# Patient Record
Sex: Male | Born: 1990 | Race: White | Hispanic: No | Marital: Single | Smoking: Never smoker
Health system: Southern US, Community
[De-identification: ages and names within clinical notes are randomized; demographics above are authoritative.]

---

## 2016-07-26 DIAGNOSIS — Z86018 Personal history of other benign neoplasm: Secondary | ICD-10-CM | POA: Diagnosis not present

## 2016-07-26 DIAGNOSIS — D2271 Melanocytic nevi of right lower limb, including hip: Secondary | ICD-10-CM | POA: Diagnosis not present

## 2016-07-26 DIAGNOSIS — D485 Neoplasm of uncertain behavior of skin: Secondary | ICD-10-CM | POA: Diagnosis not present

## 2016-07-26 DIAGNOSIS — D225 Melanocytic nevi of trunk: Secondary | ICD-10-CM | POA: Diagnosis not present

## 2016-07-26 DIAGNOSIS — D2262 Melanocytic nevi of left upper limb, including shoulder: Secondary | ICD-10-CM | POA: Diagnosis not present

## 2016-07-26 DIAGNOSIS — D224 Melanocytic nevi of scalp and neck: Secondary | ICD-10-CM | POA: Diagnosis not present

## 2017-01-04 DIAGNOSIS — J0100 Acute maxillary sinusitis, unspecified: Secondary | ICD-10-CM | POA: Diagnosis not present

## 2017-02-09 ENCOUNTER — Emergency Department (HOSPITAL_COMMUNITY)
Admission: EM | Admit: 2017-02-09 | Discharge: 2017-02-10 | Disposition: A | Discharge status: Home / Self Care | Payer: Federal, State, Local not specified - PPO | Attending: Emergency Medicine | Admitting: Emergency Medicine

## 2017-02-09 ENCOUNTER — Emergency Department (HOSPITAL_COMMUNITY): Payer: Federal, State, Local not specified - PPO

## 2017-02-09 ENCOUNTER — Encounter (HOSPITAL_COMMUNITY): Payer: Self-pay

## 2017-02-09 DIAGNOSIS — W320XXA Accidental handgun discharge, initial encounter: Secondary | ICD-10-CM | POA: Diagnosis not present

## 2017-02-09 DIAGNOSIS — Y939 Activity, unspecified: Secondary | ICD-10-CM | POA: Diagnosis not present

## 2017-02-09 DIAGNOSIS — S79922A Unspecified injury of left thigh, initial encounter: Secondary | ICD-10-CM | POA: Diagnosis not present

## 2017-02-09 DIAGNOSIS — Y999 Unspecified external cause status: Secondary | ICD-10-CM | POA: Insufficient documentation

## 2017-02-09 DIAGNOSIS — W3400XA Accidental discharge from unspecified firearms or gun, initial encounter: Secondary | ICD-10-CM | POA: Diagnosis not present

## 2017-02-09 DIAGNOSIS — Y929 Unspecified place or not applicable: Secondary | ICD-10-CM | POA: Insufficient documentation

## 2017-02-09 DIAGNOSIS — Z23 Encounter for immunization: Secondary | ICD-10-CM | POA: Insufficient documentation

## 2017-02-09 DIAGNOSIS — S85902A Unspecified injury of unspecified blood vessel at lower leg level, left leg, initial encounter: Secondary | ICD-10-CM | POA: Diagnosis not present

## 2017-02-09 DIAGNOSIS — T148XXA Other injury of unspecified body region, initial encounter: Secondary | ICD-10-CM | POA: Diagnosis not present

## 2017-02-09 DIAGNOSIS — S71132A Puncture wound without foreign body, left thigh, initial encounter: Secondary | ICD-10-CM

## 2017-02-09 DIAGNOSIS — S71102A Unspecified open wound, left thigh, initial encounter: Secondary | ICD-10-CM | POA: Diagnosis not present

## 2017-02-09 LAB — PREPARE FRESH FROZEN PLASMA
Unit division: 0
Unit division: 0

## 2017-02-09 LAB — I-STAT CHEM 8, ED
BUN: 15 mg/dL (ref 6–20)
CALCIUM ION: 1.1 mmol/L — AB (ref 1.15–1.40)
CHLORIDE: 104 mmol/L (ref 101–111)
Creatinine, Ser: 1 mg/dL (ref 0.61–1.24)
GLUCOSE: 96 mg/dL (ref 65–99)
HCT: 44 % (ref 39.0–52.0)
HEMOGLOBIN: 15 g/dL (ref 13.0–17.0)
POTASSIUM: 3.5 mmol/L (ref 3.5–5.1)
SODIUM: 139 mmol/L (ref 135–145)
TCO2: 23 mmol/L (ref 0–100)

## 2017-02-09 LAB — BPAM FFP
BLOOD PRODUCT EXPIRATION DATE: 201806302359
BLOOD PRODUCT EXPIRATION DATE: 201806302359
ISSUE DATE / TIME: 201806142223
ISSUE DATE / TIME: 201806142223
UNIT TYPE AND RH: 6200
Unit Type and Rh: 600

## 2017-02-09 LAB — I-STAT CG4 LACTIC ACID, ED: LACTIC ACID, VENOUS: 1.21 mmol/L (ref 0.5–1.9)

## 2017-02-09 MED ORDER — TETANUS-DIPHTH-ACELL PERTUSSIS 5-2.5-18.5 LF-MCG/0.5 IM SUSP
0.5000 mL | Freq: Once | INTRAMUSCULAR | Status: AC
  Administered 2017-02-09: 0.5 mL via INTRAMUSCULAR
  Filled 2017-02-09: qty 0.5

## 2017-02-09 MED ORDER — IOPAMIDOL (ISOVUE-370) INJECTION 76%
INTRAVENOUS | Status: AC
  Administered 2017-02-09: 100 mL
  Filled 2017-02-09: qty 100

## 2017-02-09 MED ORDER — HYDROCODONE-ACETAMINOPHEN 5-325 MG PO TABS
1.0000 | ORAL_TABLET | Freq: Once | ORAL | Status: AC
  Administered 2017-02-09: 1 via ORAL
  Filled 2017-02-09: qty 1

## 2017-02-09 MED ORDER — HYDROCODONE-ACETAMINOPHEN 5-325 MG PO TABS: 1.0000 | ORAL_TABLET | Freq: Four times a day (QID) | ORAL | 0 refills | Status: AC | PRN
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
## 2017-02-09 NOTE — Discharge Instructions (Addendum)
Wound will heal by secondary intention. Please return to ED for spreading redness, increasing pain, or pustular drainage from wound. Please followup in trauma clinic.

## 2017-02-09 NOTE — ED Notes (Signed)
Patient transported to CT 

## 2017-02-09 NOTE — ED Notes (Signed)
Pt stable, ambulatory, states understanding of discharge instructions 

## 2017-02-10 LAB — BPAM RBC
BLOOD PRODUCT EXPIRATION DATE: 201807022359
Blood Product Expiration Date: 201806212359
ISSUE DATE / TIME: 201806142222
ISSUE DATE / TIME: 201806142222
UNIT TYPE AND RH: 9500
Unit Type and Rh: 9500

## 2017-02-10 LAB — TYPE AND SCREEN
ABO/RH(D): O POS
Antibody Screen: NEGATIVE
UNIT DIVISION: 0
Unit division: 0

## 2017-02-10 LAB — ABO/RH: ABO/RH(D): O POS

## 2017-02-23 DIAGNOSIS — W3400XA Accidental discharge from unspecified firearms or gun, initial encounter: Secondary | ICD-10-CM | POA: Diagnosis not present

## 2017-02-23 DIAGNOSIS — S71109A Unspecified open wound, unspecified thigh, initial encounter: Secondary | ICD-10-CM | POA: Diagnosis not present

## 2017-07-26 DIAGNOSIS — Z86018 Personal history of other benign neoplasm: Secondary | ICD-10-CM | POA: Diagnosis not present

## 2017-07-26 DIAGNOSIS — D2272 Melanocytic nevi of left lower limb, including hip: Secondary | ICD-10-CM | POA: Diagnosis not present

## 2017-07-26 DIAGNOSIS — D485 Neoplasm of uncertain behavior of skin: Secondary | ICD-10-CM | POA: Diagnosis not present

## 2017-07-26 DIAGNOSIS — D224 Melanocytic nevi of scalp and neck: Secondary | ICD-10-CM | POA: Diagnosis not present

## 2017-07-26 DIAGNOSIS — D225 Melanocytic nevi of trunk: Secondary | ICD-10-CM | POA: Diagnosis not present

## 2017-07-31 DIAGNOSIS — Z833 Family history of diabetes mellitus: Secondary | ICD-10-CM | POA: Diagnosis not present

## 2017-07-31 DIAGNOSIS — S66911A Strain of unspecified muscle, fascia and tendon at wrist and hand level, right hand, initial encounter: Secondary | ICD-10-CM | POA: Diagnosis not present
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
